# Patient Record
Sex: Male | Born: 1960 | Race: White | Hispanic: No | Marital: Married | Smoking: Never smoker
Health system: Southern US, Community
[De-identification: ages and names within clinical notes are randomized; demographics above are authoritative.]

## PROBLEM LIST (undated history)

## (undated) DIAGNOSIS — I10 Essential (primary) hypertension: Secondary | ICD-10-CM

---

## 2015-09-06 ENCOUNTER — Emergency Department (HOSPITAL_BASED_OUTPATIENT_CLINIC_OR_DEPARTMENT_OTHER): Payer: 59

## 2015-09-06 ENCOUNTER — Emergency Department (HOSPITAL_COMMUNITY): Payer: 59 | Admitting: Anesthesiology

## 2015-09-06 ENCOUNTER — Observation Stay (HOSPITAL_BASED_OUTPATIENT_CLINIC_OR_DEPARTMENT_OTHER)
Admission: EM | Admit: 2015-09-06 | Discharge: 2015-09-07 | Disposition: A | Discharge status: Home / Self Care | Payer: 59 | Attending: General Surgery | Admitting: General Surgery

## 2015-09-06 ENCOUNTER — Encounter (HOSPITAL_BASED_OUTPATIENT_CLINIC_OR_DEPARTMENT_OTHER): Payer: Self-pay | Admitting: Emergency Medicine

## 2015-09-06 ENCOUNTER — Encounter (HOSPITAL_COMMUNITY)
Admission: EM | Disposition: A | Discharge status: Home / Self Care | Payer: Self-pay | Source: Home / Self Care | Attending: Emergency Medicine

## 2015-09-06 DIAGNOSIS — K3580 Unspecified acute appendicitis: Secondary | ICD-10-CM

## 2015-09-06 DIAGNOSIS — I10 Essential (primary) hypertension: Secondary | ICD-10-CM | POA: Insufficient documentation

## 2015-09-06 DIAGNOSIS — K353 Acute appendicitis with localized peritonitis: Principal | ICD-10-CM | POA: Insufficient documentation

## 2015-09-06 DIAGNOSIS — K3533 Acute appendicitis with perforation and localized peritonitis, with abscess: Secondary | ICD-10-CM | POA: Diagnosis present

## 2015-09-06 DIAGNOSIS — R1031 Right lower quadrant pain: Secondary | ICD-10-CM | POA: Diagnosis present

## 2015-09-06 HISTORY — PX: LAPAROSCOPIC APPENDECTOMY: SHX408

## 2015-09-06 HISTORY — DX: Essential (primary) hypertension: I10

## 2015-09-06 LAB — COMPREHENSIVE METABOLIC PANEL
ALBUMIN: 3.6 g/dL (ref 3.5–5.0)
ALT: 22 U/L (ref 17–63)
AST: 22 U/L (ref 15–41)
Alkaline Phosphatase: 69 U/L (ref 38–126)
Anion gap: 10 (ref 5–15)
BUN: 19 mg/dL (ref 6–20)
CHLORIDE: 104 mmol/L (ref 101–111)
CO2: 21 mmol/L — AB (ref 22–32)
CREATININE: 1.41 mg/dL — AB (ref 0.61–1.24)
Calcium: 9.2 mg/dL (ref 8.9–10.3)
GFR calc Af Amer: >60 mL/min (ref >60)
GFR, EST NON AFRICAN AMERICAN: 55 mL/min — AB (ref >60)
GLUCOSE: 133 mg/dL — AB (ref 65–99)
POTASSIUM: 3.8 mmol/L (ref 3.5–5.1)
SODIUM: 135 mmol/L (ref 135–145)
Total Bilirubin: 1.9 mg/dL — ABNORMAL HIGH (ref 0.3–1.2)
Total Protein: 7.5 g/dL (ref 6.5–8.1)

## 2015-09-06 LAB — URINE MICROSCOPIC-ADD ON
Squamous Epithelial / LPF: NONE SEEN
WBC, UA: NONE SEEN WBC/hpf (ref 0–5)

## 2015-09-06 LAB — CBC
HEMATOCRIT: 43 % (ref 39.0–52.0)
Hemoglobin: 15.2 g/dL (ref 13.0–17.0)
MCH: 32.6 pg (ref 26.0–34.0)
MCHC: 35.3 g/dL (ref 30.0–36.0)
MCV: 92.3 fL (ref 78.0–100.0)
Platelets: 212 10*3/uL (ref 150–400)
RBC: 4.66 MIL/uL (ref 4.22–5.81)
RDW: 13 % (ref 11.5–15.5)
WBC: 15.9 10*3/uL — AB (ref 4.0–10.5)

## 2015-09-06 LAB — URINALYSIS, ROUTINE W REFLEX MICROSCOPIC
GLUCOSE, UA: NEGATIVE mg/dL
KETONES UR: 15 mg/dL — AB
LEUKOCYTES UA: NEGATIVE
Nitrite: NEGATIVE
PH: 6 (ref 5.0–8.0)
Protein, ur: 100 mg/dL — AB
Specific Gravity, Urine: 1.03 (ref 1.005–1.030)

## 2015-09-06 LAB — CREATININE, SERUM
Creatinine, Ser: 1.2 mg/dL (ref 0.61–1.24)
GFR calc non Af Amer: >60 mL/min (ref >60)

## 2015-09-06 LAB — LIPASE, BLOOD: LIPASE: 17 U/L (ref 11–51)

## 2015-09-06 SURGERY — APPENDECTOMY, LAPAROSCOPIC
Anesthesia: General | Site: Abdomen

## 2015-09-06 MED ORDER — IBUPROFEN 600 MG PO TABS: 600.0000 mg | ORAL_TABLET | Freq: Four times a day (QID) | ORAL | Status: DC | PRN

## 2015-09-06 MED ORDER — BUPIVACAINE-EPINEPHRINE (PF) 0.25% -1:200000 IJ SOLN
INTRAMUSCULAR | Status: AC
  Filled 2015-09-06: qty 30

## 2015-09-06 MED ORDER — ROCURONIUM BROMIDE 50 MG/5ML IV SOLN
INTRAVENOUS | Status: AC
  Filled 2015-09-06: qty 1

## 2015-09-06 MED ORDER — ONDANSETRON HCL 4 MG/2ML IJ SOLN
INTRAMUSCULAR | Status: AC
  Filled 2015-09-06: qty 2

## 2015-09-06 MED ORDER — HYDROMORPHONE HCL 1 MG/ML IJ SOLN
0.2500 mg | INTRAMUSCULAR | Status: DC | PRN
  Administered 2015-09-06: 0.5 mg via INTRAVENOUS

## 2015-09-06 MED ORDER — LIDOCAINE HCL (CARDIAC) 20 MG/ML IV SOLN
INTRAVENOUS | Status: AC
Start: 2015-09-06 — End: 2015-09-06
  Filled 2015-09-06: qty 5

## 2015-09-06 MED ORDER — SUCCINYLCHOLINE CHLORIDE 20 MG/ML IJ SOLN
INTRAMUSCULAR | Status: DC | PRN
  Administered 2015-09-06: 120 mg via INTRAVENOUS

## 2015-09-06 MED ORDER — BUPIVACAINE-EPINEPHRINE 0.25% -1:200000 IJ SOLN
INTRAMUSCULAR | Status: DC | PRN
  Administered 2015-09-06: 10 mL

## 2015-09-06 MED ORDER — LIDOCAINE HCL (CARDIAC) 20 MG/ML IV SOLN
INTRAVENOUS | Status: DC | PRN
  Administered 2015-09-06: 100 mg via INTRAVENOUS

## 2015-09-06 MED ORDER — OXYCODONE HCL 5 MG PO TABS: 5.0000 mg | ORAL_TABLET | ORAL | Status: DC | PRN

## 2015-09-06 MED ORDER — ONDANSETRON HCL 4 MG/2ML IJ SOLN
INTRAMUSCULAR | Status: DC | PRN
  Administered 2015-09-06: 4 mg via INTRAVENOUS

## 2015-09-06 MED ORDER — MIDAZOLAM HCL 5 MG/5ML IJ SOLN
INTRAMUSCULAR | Status: DC | PRN
  Administered 2015-09-06: 2 mg via INTRAVENOUS

## 2015-09-06 MED ORDER — SUGAMMADEX SODIUM 500 MG/5ML IV SOLN
INTRAVENOUS | Status: AC
  Filled 2015-09-06: qty 5

## 2015-09-06 MED ORDER — SUFENTANIL CITRATE 50 MCG/ML IV SOLN
INTRAVENOUS | Status: AC
  Filled 2015-09-06: qty 1

## 2015-09-06 MED ORDER — PHENYLEPHRINE 40 MCG/ML (10ML) SYRINGE FOR IV PUSH (FOR BLOOD PRESSURE SUPPORT)
PREFILLED_SYRINGE | INTRAVENOUS | Status: AC
  Filled 2015-09-06: qty 10

## 2015-09-06 MED ORDER — PHENYLEPHRINE HCL 10 MG/ML IJ SOLN
INTRAMUSCULAR | Status: DC | PRN
  Administered 2015-09-06 (×3): 80 ug via INTRAVENOUS

## 2015-09-06 MED ORDER — DIPHENHYDRAMINE HCL 25 MG PO CAPS: 25.0000 mg | ORAL_CAPSULE | Freq: Four times a day (QID) | ORAL | Status: DC | PRN

## 2015-09-06 MED ORDER — PROPOFOL 10 MG/ML IV BOLUS
INTRAVENOUS | Status: AC
  Filled 2015-09-06: qty 20

## 2015-09-06 MED ORDER — IOPAMIDOL (ISOVUE-300) INJECTION 61%
100.0000 mL | Freq: Once | INTRAVENOUS | Status: AC | PRN
  Administered 2015-09-06: 100 mL via INTRAVENOUS

## 2015-09-06 MED ORDER — DEXAMETHASONE SODIUM PHOSPHATE 4 MG/ML IJ SOLN
INTRAMUSCULAR | Status: DC | PRN
  Administered 2015-09-06: 4 mg via INTRAVENOUS

## 2015-09-06 MED ORDER — MORPHINE SULFATE (PF) 2 MG/ML IV SOLN: 2.0000 mg | INTRAVENOUS | Status: DC | PRN

## 2015-09-06 MED ORDER — LACTATED RINGERS IV SOLN
INTRAVENOUS | Status: DC | PRN
  Administered 2015-09-06 (×2): via INTRAVENOUS

## 2015-09-06 MED ORDER — SUCCINYLCHOLINE CHLORIDE 20 MG/ML IJ SOLN
INTRAMUSCULAR | Status: AC
  Filled 2015-09-06: qty 1

## 2015-09-06 MED ORDER — METHOCARBAMOL 500 MG PO TABS: 500.0000 mg | ORAL_TABLET | Freq: Four times a day (QID) | ORAL | Status: DC | PRN

## 2015-09-06 MED ORDER — SODIUM CHLORIDE 0.9 % IJ SOLN
INTRAMUSCULAR | Status: AC
  Filled 2015-09-06: qty 10

## 2015-09-06 MED ORDER — LACTATED RINGERS IV SOLN: INTRAVENOUS | Status: DC

## 2015-09-06 MED ORDER — DEXAMETHASONE SODIUM PHOSPHATE 4 MG/ML IJ SOLN
INTRAMUSCULAR | Status: AC
  Filled 2015-09-06: qty 1

## 2015-09-06 MED ORDER — PROPOFOL 10 MG/ML IV BOLUS
INTRAVENOUS | Status: DC | PRN
  Administered 2015-09-06: 150 mg via INTRAVENOUS

## 2015-09-06 MED ORDER — SUGAMMADEX SODIUM 200 MG/2ML IV SOLN
INTRAVENOUS | Status: DC | PRN
  Administered 2015-09-06: 500 mg via INTRAVENOUS

## 2015-09-06 MED ORDER — SODIUM CHLORIDE 0.9 % IV SOLN
INTRAVENOUS | Status: DC
  Administered 2015-09-06: 22:00:00 via INTRAVENOUS

## 2015-09-06 MED ORDER — ENOXAPARIN SODIUM 40 MG/0.4ML ~~LOC~~ SOLN
40.0000 mg | SUBCUTANEOUS | Status: DC
  Administered 2015-09-07: 40 mg via SUBCUTANEOUS
  Filled 2015-09-06: qty 0.4

## 2015-09-06 MED ORDER — ACETAMINOPHEN 500 MG PO TABS
1000.0000 mg | ORAL_TABLET | Freq: Four times a day (QID) | ORAL | Status: DC
  Administered 2015-09-07 (×2): 1000 mg via ORAL
  Filled 2015-09-06 (×2): qty 2

## 2015-09-06 MED ORDER — 0.9 % SODIUM CHLORIDE (POUR BTL) OPTIME
TOPICAL | Status: DC | PRN
  Administered 2015-09-06: 1000 mL

## 2015-09-06 MED ORDER — MIDAZOLAM HCL 2 MG/2ML IJ SOLN
INTRAMUSCULAR | Status: AC
  Filled 2015-09-06: qty 2

## 2015-09-06 MED ORDER — DEXTROSE 5 % IV SOLN
2.0000 g | INTRAVENOUS | Status: DC
  Filled 2015-09-06: qty 2

## 2015-09-06 MED ORDER — DEXTROSE 5 % IV SOLN
2.0000 g | Freq: Once | INTRAVENOUS | Status: AC
  Administered 2015-09-06: 2 g via INTRAVENOUS
  Filled 2015-09-06: qty 2

## 2015-09-06 MED ORDER — HYDROMORPHONE HCL 1 MG/ML IJ SOLN
INTRAMUSCULAR | Status: AC
  Filled 2015-09-06: qty 1

## 2015-09-06 MED ORDER — SUFENTANIL CITRATE 50 MCG/ML IV SOLN
INTRAVENOUS | Status: DC | PRN
  Administered 2015-09-06: 10 ug via INTRAVENOUS
  Administered 2015-09-06: 15 ug via INTRAVENOUS

## 2015-09-06 MED ORDER — MEPERIDINE HCL 25 MG/ML IJ SOLN: 6.2500 mg | INTRAMUSCULAR | Status: DC | PRN

## 2015-09-06 MED ORDER — ONDANSETRON 4 MG PO TBDP
4.0000 mg | ORAL_TABLET | Freq: Four times a day (QID) | ORAL | Status: DC | PRN
Start: 2015-09-06 — End: 2015-09-07

## 2015-09-06 MED ORDER — IBUPROFEN 800 MG PO TABS
800.0000 mg | ORAL_TABLET | Freq: Once | ORAL | Status: AC
  Administered 2015-09-06: 800 mg via ORAL
  Filled 2015-09-06: qty 1

## 2015-09-06 MED ORDER — METRONIDAZOLE IN NACL 5-0.79 MG/ML-% IV SOLN
500.0000 mg | Freq: Three times a day (TID) | INTRAVENOUS | Status: DC
  Administered 2015-09-07 (×2): 500 mg via INTRAVENOUS
  Filled 2015-09-06 (×4): qty 100

## 2015-09-06 MED ORDER — ONDANSETRON HCL 4 MG/2ML IJ SOLN: 4.0000 mg | Freq: Four times a day (QID) | INTRAMUSCULAR | Status: DC | PRN

## 2015-09-06 MED ORDER — ROCURONIUM BROMIDE 100 MG/10ML IV SOLN
INTRAVENOUS | Status: DC | PRN
Start: 2015-09-06 — End: 2015-09-06
  Administered 2015-09-06: 30 mg via INTRAVENOUS

## 2015-09-06 MED ORDER — DIPHENHYDRAMINE HCL 50 MG/ML IJ SOLN: 25.0000 mg | Freq: Four times a day (QID) | INTRAMUSCULAR | Status: DC | PRN

## 2015-09-06 SURGICAL SUPPLY — 40 items
APPLIER CLIP ROT 10 11.4 M/L (STAPLE)
BENZOIN TINCTURE PRP APPL 2/3 (GAUZE/BANDAGES/DRESSINGS) ×3 IMPLANT
BLADE SURG ROTATE 9660 (MISCELLANEOUS) IMPLANT
CANISTER SUCTION 2500CC (MISCELLANEOUS) ×3 IMPLANT
CHLORAPREP W/TINT 26ML (MISCELLANEOUS) ×3 IMPLANT
CLIP APPLIE ROT 10 11.4 M/L (STAPLE) IMPLANT
CLOSURE WOUND 1/2 X4 (GAUZE/BANDAGES/DRESSINGS) ×1
COVER SURGICAL LIGHT HANDLE (MISCELLANEOUS) ×3 IMPLANT
CUTTER FLEX LINEAR 45M (STAPLE) ×3 IMPLANT
DRSG TEGADERM 2-3/8X2-3/4 SM (GAUZE/BANDAGES/DRESSINGS) ×6 IMPLANT
DRSG TEGADERM 4X4.75 (GAUZE/BANDAGES/DRESSINGS) ×3 IMPLANT
ELECT REM PT RETURN 9FT ADLT (ELECTROSURGICAL) ×3
ELECTRODE REM PT RTRN 9FT ADLT (ELECTROSURGICAL) ×1 IMPLANT
ENDOLOOP SUT PDS II  0 18 (SUTURE)
ENDOLOOP SUT PDS II 0 18 (SUTURE) IMPLANT
FILTER SMOKE EVAC LAPAROSHD (FILTER) ×3 IMPLANT
GAUZE SPONGE 2X2 8PLY STRL LF (GAUZE/BANDAGES/DRESSINGS) ×1 IMPLANT
GLOVE BIO SURGEON STRL SZ7 (GLOVE) ×3 IMPLANT
GLOVE BIOGEL PI IND STRL 7.5 (GLOVE) ×1 IMPLANT
GLOVE BIOGEL PI INDICATOR 7.5 (GLOVE) ×2
GOWN STRL REUS W/ TWL LRG LVL3 (GOWN DISPOSABLE) ×3 IMPLANT
GOWN STRL REUS W/TWL LRG LVL3 (GOWN DISPOSABLE) ×6
KIT BASIN OR (CUSTOM PROCEDURE TRAY) ×3 IMPLANT
KIT ROOM TURNOVER OR (KITS) ×3 IMPLANT
NS IRRIG 1000ML POUR BTL (IV SOLUTION) ×3 IMPLANT
PAD ARMBOARD 7.5X6 YLW CONV (MISCELLANEOUS) ×6 IMPLANT
POUCH SPECIMEN RETRIEVAL 10MM (ENDOMECHANICALS) ×3 IMPLANT
RELOAD STAPLE TA45 3.5 REG BLU (ENDOMECHANICALS) ×3 IMPLANT
SCALPEL HARMONIC ACE (MISCELLANEOUS) ×3 IMPLANT
SET IRRIG TUBING LAPAROSCOPIC (IRRIGATION / IRRIGATOR) ×3 IMPLANT
SPECIMEN JAR SMALL (MISCELLANEOUS) ×3 IMPLANT
SPONGE GAUZE 2X2 STER 10/PKG (GAUZE/BANDAGES/DRESSINGS) ×2
STRIP CLOSURE SKIN 1/2X4 (GAUZE/BANDAGES/DRESSINGS) ×2 IMPLANT
SUT MNCRL AB 4-0 PS2 18 (SUTURE) ×3 IMPLANT
TOWEL OR 17X24 6PK STRL BLUE (TOWEL DISPOSABLE) ×3 IMPLANT
TOWEL OR 17X26 10 PK STRL BLUE (TOWEL DISPOSABLE) ×3 IMPLANT
TRAY LAPAROSCOPIC MC (CUSTOM PROCEDURE TRAY) ×3 IMPLANT
TROCAR XCEL BLADELESS 5X75MML (TROCAR) ×6 IMPLANT
TROCAR XCEL BLUNT TIP 100MML (ENDOMECHANICALS) ×3 IMPLANT
TUBING INSUFFLATION (TUBING) ×3 IMPLANT

## 2015-09-06 NOTE — Anesthesia Procedure Notes (Signed)
Procedure Name: Intubation Date/Time: 09/06/2015 8:13 PM Performed by: Melina SchoolsBANKS, Michaeline Eckersley J Pre-anesthesia Checklist: Patient identified, Emergency Drugs available, Suction available, Patient being monitored and Timeout performed Patient Re-evaluated:Patient Re-evaluated prior to inductionOxygen Delivery Method: Circle system utilized Preoxygenation: Pre-oxygenation with 100% oxygen Intubation Type: IV induction, Rapid sequence and Cricoid Pressure applied Ventilation: Mask ventilation without difficulty Laryngoscope Size: 3 Grade View: Grade II Tube type: Oral Tube size: 8.0 mm Number of attempts: 1 Airway Equipment and Method: Stylet Placement Confirmation: ETT inserted through vocal cords under direct vision,  positive ETCO2 and breath sounds checked- equal and bilateral Secured at: 23 cm Tube secured with: Tape Dental Injury: Teeth and Oropharynx as per pre-operative assessment

## 2015-09-06 NOTE — ED Notes (Signed)
Pt tolerated PO contrast well

## 2015-09-06 NOTE — ED Notes (Signed)
Dr. Corliss Skainssuei taking patient to OR bay in wheelchair at this time - informed consent obtained.

## 2015-09-06 NOTE — Op Note (Signed)
Appendectomy, Lap, Procedure Note  Indications: The patient presented with a 3-day history of right-sided abdominal pain. A CT scan revealed findings consistent with acute appendicitis.  Pre-operative Diagnosis: Acute appendicitis without mention of peritonitis  Post-operative Diagnosis: Acute appendicitis with peritoneal abscess  Surgeon: Wynona Luna.   Assistants: none  Anesthesia: General endotracheal anesthesia  ASA Class: 2E  Procedure Details  The patient was seen again in the Holding Room. The risks, benefits, complications, treatment options, and expected outcomes were discussed with the patient and/or family. The possibilities of reaction to medication, perforation of viscus, bleeding, recurrent infection, finding a normal appendix, the need for additional procedures, failure to diagnose a condition, and creating a complication requiring transfusion or operation were discussed. There was concurrence with the proposed plan and informed consent was obtained. The site of surgery was properly noted. The patient was taken to Operating Room, identified as Lillard Anes and the procedure verified as Appendectomy. A Time Out was held and the above information confirmed.  The patient was placed in the supine position and general anesthesia was induced.  The abdomen was prepped and draped in a sterile fashion. A one centimeter infraumbilical incision was made.  Dissection was carried down to the fascia bluntly.  The fascia was incised vertically.  We entered the peritoneal cavity bluntly.  A pursestring suture was passed around the incision with a 0 Vicryl.  The Hasson cannula was introduced into the abdomen and the tails of the suture were used to hold the Hasson in place.   The pneumoperitoneum was then established maintaining a maximum pressure of 15 mmHg.  Additional 5 mm cannulas then placed in the left lower quadrant of the abdomen and the right upper quadrant under direct visualization.  A careful evaluation of the entire abdomen was carried out. The patient was placed in Trendelenburg and left lateral decubitus position.  The scope was moved to the right upper quadrant port site. The cecum was identified.  The terminal ileum is densely adherent to the side of the cecum overlying the appendix.  We bluntly peeled this loop of ileum away and exposed a 2 cm appendiceal abscess.  The appendix was bluntly mobilized and showed some necrosis near its tip.  The proximal appendix is normal in size.The appendix was carefully dissected. The appendix was was skeletonized with the harmonic scalpel.   The appendix was divided at its base using an endo-GIA stapler. Minimal appendiceal stump was left in place. There was no evidence of bleeding, leakage, or complication after division of the appendix. Irrigation was also performed with 300 ml of saline and irrigate suctioned from the abdomen as well.  The umbilical port site was closed with the purse string suture. There was no residual palpable fascial defect.  The trocar site skin wounds were closed with 4-0 Monocryl.  Instrument, sponge, and needle counts were correct at the conclusion of the case.   Findings: The appendix was found to be inflamed. There were signs of necrosis.  There was perforation. There was abscess formation.  Estimated Blood Loss:  less than 50 mL         Drains: none         Specimens: appendix         Complications:  None; patient tolerated the procedure well.         Disposition: PACU - hemodynamically stable.         Condition: stable   Wilmon Arms. Corliss Skains, MD, [facility] Surgery  General/  Trauma Surgery  09/06/2015 9:07 PM

## 2015-09-06 NOTE — ED Provider Notes (Signed)
Patient is here with history of right-sided abdominal pain and fever.  Been going on for last 3 days ago worse today.  Some chills.  No nausea or vomiting.  Was turned over by Dr. Cherylynn RidgesGholston awaiting results of a CT scan.  CT showed acute appendicitis without rupture.  Patient is requesting to drive his own car [facility] to [facility].  The general surgeon at [facility] was contacted and will see the patient in the emergency room at [facility].  Patient was instructed to go directly to [facility] and not to stop and eat or drink anything prior to arriving at [facility].  He understands and will follow these instructions.  Nelva Nayobert Lilinoe Acklin, MD 09/06/15 [phone]

## 2015-09-06 NOTE — ED Notes (Signed)
MD at bedside. 

## 2015-09-06 NOTE — ED Notes (Signed)
Surgeon at bedside.  

## 2015-09-06 NOTE — H&P (Signed)
Johnny Perkins is an 55 y.o. male.   Chief Complaint: RLQ abdominal pain HPI:  This is a 55 year old male in town for [employer] with a past history of hypertension presents with right-sided abdominal pain. Has been having vague right-sided abdominal pain for the last 3 days, worse today. Has been having low-grade subjective fevers as well. No nausea, vomiting, or diarrhea. No constipation but he tried mag citrate with no change in his symptoms. Feels like he is urinating more but no dysuria or hematuria. No back pain.  Past Medical History  Diagnosis Date  . Hypertension     History reviewed. No pertinent past surgical history.  History reviewed. No pertinent family history. Social History:  reports that he has never smoked. He does not have any smokeless tobacco history on file. He reports that he does not drink alcohol. His drug history is not on file.  Allergies: No Known Allergies  Prior to Admission medications   Not on File     Results for orders placed or performed during the hospital encounter of 09/06/15 (from the past 48 hour(s))  Lipase, blood     Status: None   Collection Time: 09/06/15  2:29 PM  Result Value Ref Range   Lipase 17 11 - 51 U/L  Comprehensive metabolic panel     Status: Abnormal   Collection Time: 09/06/15  2:29 PM  Result Value Ref Range   Sodium 135 135 - 145 mmol/L   Potassium 3.8 3.5 - 5.1 mmol/L   Chloride 104 101 - 111 mmol/L   CO2 21 (L) 22 - 32 mmol/L   Glucose, Bld 133 (H) 65 - 99 mg/dL   BUN 19 6 - 20 mg/dL   Creatinine, Ser 1.41 (H) 0.61 - 1.24 mg/dL   Calcium 9.2 8.9 - 10.3 mg/dL   Total Protein 7.5 6.5 - 8.1 g/dL   Albumin 3.6 3.5 - 5.0 g/dL   AST 22 15 - 41 U/L   ALT 22 17 - 63 U/L   Alkaline Phosphatase 69 38 - 126 U/L   Total Bilirubin 1.9 (H) 0.3 - 1.2 mg/dL   GFR calc non Af Amer 55 (L) >60 mL/min   GFR calc Af Amer >60 >60 mL/min    Comment: (NOTE) The eGFR has been calculated using the CKD EPI equation. This calculation  has not been validated in all clinical situations. eGFR's persistently <60 mL/min signify possible Chronic Kidney Disease.    Anion gap 10 5 - 15  CBC     Status: Abnormal   Collection Time: 09/06/15  2:29 PM  Result Value Ref Range   WBC 15.9 (H) 4.0 - 10.5 K/uL   RBC 4.66 4.22 - 5.81 MIL/uL   Hemoglobin 15.2 13.0 - 17.0 g/dL   HCT 43.0 39.0 - 52.0 %   MCV 92.3 78.0 - 100.0 fL   MCH 32.6 26.0 - 34.0 pg   MCHC 35.3 30.0 - 36.0 g/dL   RDW 13.0 11.5 - 15.5 %   Platelets 212 150 - 400 K/uL  Urinalysis, Routine w reflex microscopic (not at [facility])     Status: Abnormal   Collection Time: 09/06/15  3:50 PM  Result Value Ref Range   Color, Urine AMBER (A) YELLOW    Comment: BIOCHEMICALS MAY BE AFFECTED BY COLOR   APPearance CLOUDY (A) CLEAR   Specific Gravity, Urine 1.030 1.005 - 1.030   pH 6.0 5.0 - 8.0   Glucose, UA NEGATIVE NEGATIVE mg/dL   Hgb urine dipstick  SMALL (A) NEGATIVE   Bilirubin Urine SMALL (A) NEGATIVE   Ketones, ur 15 (A) NEGATIVE mg/dL   Protein, ur 100 (A) NEGATIVE mg/dL   Nitrite NEGATIVE NEGATIVE   Leukocytes, UA NEGATIVE NEGATIVE  Urine microscopic-add on     Status: Abnormal   Collection Time: 09/06/15  3:50 PM  Result Value Ref Range   Squamous Epithelial / LPF NONE SEEN NONE SEEN   WBC, UA NONE SEEN 0 - 5 WBC/hpf   RBC / HPF 0-5 0 - 5 RBC/hpf   Bacteria, UA MANY (A) NONE SEEN   Casts HYALINE CASTS (A) NEGATIVE   Urine-Other MUCOUS PRESENT    Ct Abdomen Pelvis W Contrast  09/06/2015  CLINICAL DATA:  RIGHT lower quadrant pain for 3 days. Fever and chills EXAM: CT ABDOMEN AND PELVIS WITH CONTRAST TECHNIQUE: Multidetector CT imaging of the abdomen and pelvis was performed using the standard protocol following bolus administration of intravenous contrast. CONTRAST:  154m ISOVUE-300 IOPAMIDOL (ISOVUE-300) INJECTION 61% COMPARISON:  None. FINDINGS: Lower chest: Lung bases are clear. Hepatobiliary: No focal hepatic lesion. No biliary duct dilatation. Gallbladder is  normal. Common bile duct is normal. Pancreas: Pancreas is normal. No ductal dilatation. No pancreatic inflammation. Spleen: Normal spleen Adrenals/urinary tract: Adrenal glands and kidneys are normal. The ureters and bladder normal. Stomach/Bowel: Stomach, duodenum and small bowel are normal. There is inflammation in the RIGHT lower quadrant adjacent to the cecum. The appendix has an appendicolith in the mid body measuring 9 mm. Distal to the appendicolith, the appendiceal tip is distended to 15 mm (image 68, series 5). There is periappendiceal inflammation and mild fluid along the RIGHT pericolic gutter. No evidence of macro perforation or abscess. There is mild inflammation of the distal ileum adjacent to the inflamed appendix. The ascending, transverse, descending and sigmoid colon are normal. Vascular/Lymphatic: Abdominal aorta is normal caliber. There is no retroperitoneal or periportal lymphadenopathy. No pelvic lymphadenopathy. Reproductive: Prostate normal Other: No free fluid the pelvis Musculoskeletal: No aggressive osseous lesion. IMPRESSION: 1. Acute appendicitis with an appendicolith in the mid appendix. 2. Appendix is in a typical location medial to the cecum. Significant inflammation surrounding the appendix with no evidence of perforation. These results will be called to the ordering clinician or representative by the Radiologist Assistant, and communication documented in the PACS or zVision Dashboard. Electronically Signed   By: SSuzy BouchardM.D.   On: 09/06/2015 16:34    Review of Systems  Constitutional: Positive for fever. Negative for weight loss.  HENT: Negative for ear discharge, ear pain, hearing loss and tinnitus.   Eyes: Negative for blurred vision, double vision, photophobia and pain.  Respiratory: Negative for cough, sputum production and shortness of breath.   Cardiovascular: Negative for chest pain.  Gastrointestinal: Positive for abdominal pain. Negative for nausea and  vomiting.  Genitourinary: Positive for frequency. Negative for dysuria, urgency and flank pain.  Musculoskeletal: Negative for myalgias, back pain, joint pain, falls and neck pain.  Neurological: Negative for dizziness, tingling, sensory change, focal weakness, loss of consciousness and headaches.  Endo/Heme/Allergies: Does not bruise/bleed easily.  Psychiatric/Behavioral: Negative for depression, memory loss and substance abuse. The patient is not nervous/anxious.     Blood pressure 140/84, pulse 87, temperature 100.7 F (38.2 C), temperature source Oral, resp. rate 20, height 6' (1.829 m), weight 124.739 kg (275 lb), SpO2 95 %. Physical Exam  WDWN in NAD HEENT:  EOMI, sclera anicteric Neck:  No masses, no thyromegaly Lungs:  CTA bilaterally; normal respiratory effort CV:  Regular rate and rhythm; no murmurs Abd:  +bowel sounds, soft, mildly tender in RLQ; healed infraumbilical incision Ext:  Well-perfused; no edema Skin:  Warm, dry; no sign of jaundice  Assessment/Plan Acute appendicitis with no sign of perforation or abscess  To OR directly for laparoscopic appendectomy.  The surgical procedure has been discussed with the patient.  Potential risks, benefits, alternative treatments, and expected outcomes have been explained.  All of the patient's questions at this time have been answered.  The likelihood of reaching the patient's treatment goal is good.  The patient understand the proposed surgical procedure and wishes to proceed.   Maia Petties., MD 09/06/2015, 5:24 PM

## 2015-09-06 NOTE — ED Provider Notes (Signed)
CSN: [id number]     Arrival date & time 09/06/15  1333 History   First MD Initiated Contact with Patient 09/06/15 1412     Chief Complaint  Patient presents with  . Abdominal Pain     (Consider location/radiation/quality/duration/timing/severity/associated sxs/prior Treatment) HPI  55 year old male with a past history of hypertension presents with right-sided abdominal pain. Has been having vague right-sided abdominal pain for the last 3 days, worse today. Has been having low-grade subjective fevers as well. No nausea, vomiting, or diarrhea. No constipation but he tried mag citrate with no change in his symptoms. Feels like he is urinating more but no dysuria or hematuria. No back pain.  Past Medical History  Diagnosis Date  . Hypertension    History reviewed. No pertinent past surgical history. History reviewed. No pertinent family history. Social History  Substance Use Topics  . Smoking status: Never Smoker   . Smokeless tobacco: None  . Alcohol Use: No    Review of Systems  Constitutional: Positive for fever.  Gastrointestinal: Positive for abdominal pain. Negative for nausea, vomiting, diarrhea and constipation.  Genitourinary: Positive for frequency. Negative for dysuria.  Musculoskeletal: Negative for back pain.  All other systems reviewed and are negative.     Allergies  Review of patient's allergies indicates no known allergies.  Home Medications   Prior to Admission medications   Not on File   BP 117/79 mmHg  Pulse 106  Temp(Src) 100.7 F (38.2 C) (Oral)  Resp 22  Ht 6' (1.829 m)  Wt 275 lb (124.739 kg)  BMI 37.29 kg/m2  SpO2 100% Physical Exam  Constitutional: He is oriented to person, place, and time. He appears well-developed and well-nourished.  HENT:  Head: Normocephalic and atraumatic.  Right Ear: External ear normal.  Left Ear: External ear normal.  Nose: Nose normal.  Eyes: Right eye exhibits no discharge. Left eye exhibits no discharge.   Neck: Neck supple.  Cardiovascular: Normal rate, regular rhythm, normal heart sounds and intact distal pulses.   Pulmonary/Chest: Effort normal and breath sounds normal.  Abdominal: Soft. There is tenderness (Most prominent at [place]. Also pain in right mid-abdomen).  Musculoskeletal: He exhibits no edema.  Neurological: He is alert and oriented to person, place, and time.  Skin: Skin is warm and dry. He is not diaphoretic.  Nursing note and vitals reviewed.   ED Course  Procedures (including critical care time) Labs Review Labs Reviewed  COMPREHENSIVE METABOLIC PANEL - Abnormal; Notable for the following:    CO2 21 (*)    Glucose, Bld 133 (*)    Creatinine, Ser 1.41 (*)    Total Bilirubin 1.9 (*)    GFR calc non Af Amer 55 (*)    All other components within normal limits  CBC - Abnormal; Notable for the following:    WBC 15.9 (*)    All other components within normal limits  LIPASE, BLOOD  URINALYSIS, ROUTINE W REFLEX MICROSCOPIC (NOT AT [facility])    Imaging Review No results found. I have personally reviewed and evaluated these images and lab results as part of my medical decision-making.   EKG Interpretation None      MDM   Final diagnoses:  None    I am most concerned about appendicitis given presentation. No peritonitis on exam and patient declines pain meds. CT currently pending, care transferred to Dr. Radford PaxBeaton with CT and urine pending.    Pricilla LovelessScott Majour Frei, MD 09/06/15 [phone]

## 2015-09-06 NOTE — ED Notes (Signed)
Phone Hand Off report given to Chad-RN, Consulting civil engineercharge RN at [facility] ED

## 2015-09-06 NOTE — ED Notes (Signed)
Pt visiting from [place], in town for furniture market, thur awoke with fever generalized malaise, symptoms resolved, today develop rlq intermittent abdominal pain with intermittent chills, sweats

## 2015-09-06 NOTE — Anesthesia Postprocedure Evaluation (Signed)
Anesthesia Post Note  Patient: Johnny Perkins  Procedure(s) Performed: Procedure(s) (LRB): APPENDECTOMY LAPAROSCOPIC (N/A)  Patient location during evaluation: PACU Anesthesia Type: General Level of consciousness: awake and alert Pain management: pain level controlled Vital Signs Assessment: post-procedure vital signs reviewed and stable Respiratory status: spontaneous breathing, nonlabored ventilation, respiratory function stable and patient connected to nasal cannula oxygen Cardiovascular status: blood pressure returned to baseline and stable Postop Assessment: no signs of nausea or vomiting Anesthetic complications: no    Last Vitals:  Filed Vitals:   09/06/15 2136 09/06/15 2149  BP: 134/73 123/73  Pulse: 77 78  Temp: 37.3 C 37 C  Resp: 20 18    Last Pain:  Filed Vitals:   09/06/15 2151  PainSc: 3                  Shelton SilvasKevin D Andy Moye

## 2015-09-06 NOTE — ED Provider Notes (Signed)
MSE was initiated and I personally evaluated the patient and placed orders (if any) at  6:33 PM on September 06, 2015.  BP 140/84 mmHg  Pulse 87  Temp(Src) 100.7 F (38.2 C) (Oral)  Resp 20  Ht 6' (1.829 m)  Wt 124.739 kg  BMI 37.29 kg/m2  SpO2 95%   Johnny Perkins is a 55 y.o. male who is from out of [place] the furniture Johnny Beaversmarke was [facility] and dx with appendicitis. He is here for evaluation by the general surgeon. Patient in NAD and denies pain at this time.   Abdomen soft, tender with palpation RLQ.   The patient appears stable so that the remainder of the MSE may be completed by another provider.  [id number]Hope [place], [place] 09/06/15 Carlis [id number]  Loren Raceravid Yelverton, MD 09/06/15 2350

## 2015-09-06 NOTE — ED Notes (Signed)
Pt reminded to remain NPO Last fluids per pt: 1130hrs Last solids per pt: 1230hrs, very light lunch

## 2015-09-06 NOTE — Anesthesia Preprocedure Evaluation (Signed)
Anesthesia Evaluation  Patient identified by MRN, date of birth, ID band Patient awake    Reviewed: Allergy & Precautions, NPO status , Patient's Chart, lab work & pertinent test results  Airway Mallampati: III  TM Distance: >3 FB Neck ROM: Full    Dental  (+) Teeth Intact   Pulmonary neg pulmonary ROS,    breath sounds clear to auscultation       Cardiovascular hypertension, Pt. on medications  Rhythm:Regular Rate:Normal     Neuro/Psych negative neurological ROS  negative psych ROS   GI/Hepatic negative GI ROS, Neg liver ROS,   Endo/Other  negative endocrine ROS  Renal/GU negative Renal ROS  negative genitourinary   Musculoskeletal negative musculoskeletal ROS (+)   Abdominal   Peds negative pediatric ROS (+)  Hematology   Anesthesia Other Findings   Reproductive/Obstetrics negative OB ROS                             Anesthesia Physical Anesthesia Plan  ASA: II  Anesthesia Plan: General   Post-op Pain Management:    Induction: Intravenous  Airway Management Planned: Oral ETT  Additional Equipment:   Intra-op Plan:   Post-operative Plan: Extubation in OR  Informed Consent: I have reviewed the patients History and Physical, chart, labs and discussed the procedure including the risks, benefits and alternatives for the proposed anesthesia with the patient or authorized representative who has indicated his/her understanding and acceptance.   Dental advisory given  Plan Discussed with: CRNA  Anesthesia Plan Comments:         Anesthesia Quick Evaluation

## 2015-09-06 NOTE — Transfer of Care (Signed)
Immediate Anesthesia Transfer of Care Note  Patient: Johnny AnesJohn Perkins  Procedure(s) Performed: Procedure(s): APPENDECTOMY LAPAROSCOPIC (N/A)  Patient Location: PACU  Anesthesia Type:General  Level of Consciousness: alert , oriented, sedated, patient cooperative and responds to stimulation  Airway & Oxygen Therapy: Patient Spontanous Breathing and Patient connected to nasal cannula oxygen  Post-op Assessment: Report given to RN, Post -op Vital signs reviewed and stable and Patient moving all extremities X 4  Post vital signs: Reviewed and stable  Last Vitals:  Filed Vitals:   09/06/15 1702 09/06/15 2115  BP: 140/84 135/67  Pulse: 87 81  Temp:  36.8 C  Resp: 20 19    Complications: No apparent anesthesia complications

## 2015-09-06 NOTE — ED Notes (Signed)
States only surgery has been a hernia repair

## 2015-09-06 NOTE — ED Notes (Signed)
States been having rt lower quad pain for the past several days, states has used a Mag Citrate drink, with good results, been having some fevers, feeling warm. Denies N/V or D, appetite okay, states has had an increase in urination.

## 2015-09-07 LAB — CBC
HCT: 40.5 % (ref 39.0–52.0)
HEMATOCRIT: 40.1 % (ref 39.0–52.0)
HEMOGLOBIN: 13.5 g/dL (ref 13.0–17.0)
Hemoglobin: 13.6 g/dL (ref 13.0–17.0)
MCH: 31.7 pg (ref 26.0–34.0)
MCH: 31.5 pg (ref 26.0–34.0)
MCHC: 33.6 g/dL (ref 30.0–36.0)
MCHC: 33.7 g/dL (ref 30.0–36.0)
MCV: 94.4 fL (ref 78.0–100.0)
MCV: 93.5 fL (ref 78.0–100.0)
PLATELETS: 196 10*3/uL (ref 150–400)
Platelets: 185 K/uL (ref 150–400)
RBC: 4.29 MIL/uL (ref 4.22–5.81)
RBC: 4.29 MIL/uL (ref 4.22–5.81)
RDW: 13.1 % (ref 11.5–15.5)
RDW: 13.3 % (ref 11.5–15.5)
WBC: 13.6 K/uL — ABNORMAL HIGH (ref 4.0–10.5)
WBC: 13.6 10*3/uL — AB (ref 4.0–10.5)

## 2015-09-07 LAB — BASIC METABOLIC PANEL
Anion gap: 10 (ref 5–15)
BUN: 9 mg/dL (ref 6–20)
CHLORIDE: 105 mmol/L (ref 101–111)
CO2: 21 mmol/L — AB (ref 22–32)
Calcium: 8.3 mg/dL — ABNORMAL LOW (ref 8.9–10.3)
Creatinine, Ser: 0.99 mg/dL (ref 0.61–1.24)
GFR calc non Af Amer: >60 mL/min (ref >60)
Glucose, Bld: 146 mg/dL — ABNORMAL HIGH (ref 65–99)
POTASSIUM: 4.5 mmol/L (ref 3.5–5.1)
SODIUM: 136 mmol/L (ref 135–145)

## 2015-09-07 MED ORDER — OXYCODONE HCL 5 MG PO TABS: 5.0000 mg | ORAL_TABLET | Freq: Four times a day (QID) | ORAL | Status: AC | PRN

## 2015-09-07 MED ORDER — IBUPROFEN 600 MG PO TABS: 600.0000 mg | ORAL_TABLET | Freq: Four times a day (QID) | ORAL | Status: AC | PRN

## 2015-09-07 MED ORDER — ACETAMINOPHEN 500 MG PO TABS: 1000.0000 mg | ORAL_TABLET | Freq: Four times a day (QID) | ORAL | Status: AC

## 2015-09-07 MED ORDER — AMOXICILLIN-POT CLAVULANATE 875-125 MG PO TABS: 1.0000 | ORAL_TABLET | Freq: Two times a day (BID) | ORAL | Status: AC

## 2015-09-07 NOTE — Progress Notes (Signed)
IV removed. AVS given to patient. Understanding verbalized. [place] here [place]. Will stay in hotel till Wed, rest, then drive himself home with intermittent breaks. Full understanding demonstrated that he cannot drive if taking pain meds.

## 2015-09-07 NOTE — Discharge Summary (Signed)
[facility] Discharge Summary   Patient ID: Johnny Perkins MRN: [id number] DOB/AGE: [birth year] 56 y.o.  Admit date: 09/06/2015 Discharge date: 09/07/2015  Admitting Diagnosis: Acute appendicitis with appendiceal abscess  Discharge Diagnosis Patient Active Problem List   Diagnosis Date Noted  . Acute appendicitis with appendiceal abscess 09/06/2015    Consultants None  Imaging: Ct Abdomen Pelvis W Contrast  09/06/2015  CLINICAL DATA:  RIGHT lower quadrant pain for 3 days. Fever and chills EXAM: CT ABDOMEN AND PELVIS WITH CONTRAST TECHNIQUE: Multidetector CT imaging of the abdomen and pelvis was performed using the standard protocol following bolus administration of intravenous contrast. CONTRAST:  100mL ISOVUE-300 IOPAMIDOL (ISOVUE-300) INJECTION 61% COMPARISON:  None. FINDINGS: Lower chest: Lung bases are clear. Hepatobiliary: No focal hepatic lesion. No biliary duct dilatation. Gallbladder is normal. Common bile duct is normal. Pancreas: Pancreas is normal. No ductal dilatation. No pancreatic inflammation. Spleen: Normal spleen Adrenals/urinary tract: Adrenal glands and kidneys are normal. The ureters and bladder normal. Stomach/Bowel: Stomach, duodenum and small bowel are normal. There is inflammation in the RIGHT lower quadrant adjacent to the cecum. The appendix has an appendicolith in the mid body measuring 9 mm. Distal to the appendicolith, the appendiceal tip is distended to 15 mm (image 68, series 5). There is periappendiceal inflammation and mild fluid along the RIGHT pericolic gutter. No evidence of macro perforation or abscess. There is mild inflammation of the distal ileum adjacent to the inflamed appendix. The ascending, transverse, descending and sigmoid colon are normal. Vascular/Lymphatic: Abdominal aorta is normal caliber. There is no retroperitoneal or periportal lymphadenopathy. No pelvic lymphadenopathy. Reproductive: Prostate normal Other: No free fluid the  pelvis Musculoskeletal: No aggressive osseous lesion. IMPRESSION: 1. Acute appendicitis with an appendicolith in the mid appendix. 2. Appendix is in a typical location medial to the cecum. Significant inflammation surrounding the appendix with no evidence of perforation. These results will be called to the ordering clinician or representative by the Radiologist Assistant, and communication documented in the PACS or zVision Dashboard. Electronically Signed   By: Genevive BiStewart  Edmunds M.D.   On: 09/06/2015 16:34    Procedures Dr. Corliss Skainssuei (09/06/15) - Laparoscopic Appendectomy  Hospital Course:  55 year old [place] [employer] with a past history of hypertension presents to [facility] with right-sided abdominal pain. Has been having vague right-sided abdominal pain for the last 3 days, worse today. Has been having low-grade subjective fevers as well. No nausea, vomiting, or diarrhea. No constipation but he tried mag citrate with no change in his symptoms. Feels like he is urinating more but no dysuria or hematuria. No back pain.  [facility] and he drove himself here.  Workup showed acute appendicitis.  Patient was admitted and underwent procedure listed above.  He was found to have perforated appendicitis with abscess.  Tolerated procedure well and was transferred to the floor.  Diet was advanced as tolerated.  On POD #1, the patient was voiding well, tolerating diet, ambulating well, pain well controlled, vital signs stable, incisions c/d/i and felt stable for discharge home.  Patient will follow up in our office in 2 weeks and knows to call with questions or concerns.  He will call to arrange a follow up appointment with his primary care provider within 2-3 weeks [place]  He will be given 7 days of Augmentin to help resolve the infection.  We discussed thoroughly warning signs of intra-abdominal infection.  We discussed him driving  home to  [place] and the need to get out and walk around every hour to prevent blood clots in the calf.    Physical Exam: General:  Alert, NAD, pleasant, comfortable Abd:  Soft, ND, mild tenderness, incisions C/D/I    Medication List    TAKE these medications        acetaminophen 500 MG tablet  Commonly known as:  TYLENOL  Take 2 tablets (1,000 mg total) by mouth every 6 (six) hours.     amoxicillin-clavulanate 875-125 MG tablet  Commonly known as:  AUGMENTIN  Take 1 tablet by mouth 2 (two) times daily.     ibuprofen 600 MG tablet  Commonly known as:  ADVIL,MOTRIN  Take 1 tablet (600 mg total) by mouth every 6 (six) hours as needed for mild pain.     oxyCODONE 5 MG immediate release tablet  Commonly known as:  Oxy IR/ROXICODONE  Take 1-2 tablets (5-10 mg total) by mouth every 6 (six) hours as needed for moderate pain.         Follow-up Information    Schedule an appointment as soon as possible for a visit with [facility].   Why:  For post-operation check in 2-3 weeks with your Primary care provider to recheck you after your surgery      Signed: Nonie Hoyer, [facility] [phone]  09/07/2015, 8:57 AM

## 2015-09-07 NOTE — Discharge Instructions (Signed)
Your appointment is at 9:15am, please arrive at least 30min before your appointment to complete your check in paperwork.  If you are unable to arrive 30min prior to your appointment time we may have to cancel or reschedule you. ° °LAPAROSCOPIC SURGERY: POST OP INSTRUCTIONS  °1. DIET: Follow a light bland diet the first 24 hours after arrival home, such as soup, liquids, crackers, etc. Be sure to include lots of fluids daily. Avoid fast food or heavy meals as your are more likely to get nauseated. Eat a low fat the next few days after surgery.  °2. Take your usually prescribed home medications unless otherwise directed. °3. PAIN CONTROL:  °1. Pain is best controlled by a usual combination of three different methods TOGETHER:  °1. Ice/Heat °2. Over the counter pain medication °3. Prescription pain medication °2. Most patients will experience some swelling and bruising around the incisions. Ice packs or heating pads (30-60 minutes up to 6 times a day) will help. Use ice for the first few days to help decrease swelling and bruising, then switch to heat to help relax tight/sore spots and speed recovery. Some people prefer to use ice alone, heat alone, alternating between ice & heat. Experiment to what works for you. Swelling and bruising can take several weeks to resolve.  °3. It is helpful to take an over-the-counter pain medication regularly for the first few weeks. Choose one of the following that works best for you:  °1. Naproxen (Aleve, etc) Two 220mg tabs twice a day °2. Ibuprofen (Advil, etc) Three 200mg tabs four times a day (every meal & bedtime) °3. Acetaminophen (Tylenol, etc) 500-650mg four times a day (every meal & bedtime) °4. A prescription for pain medication (such as oxycodone, hydrocodone, etc) should be given to you upon discharge. Take your pain medication as prescribed.  °1. If you are having problems/concerns with the prescription medicine (does not control pain, nausea, vomiting, rash, itching,  etc), please call us [phone] to see if we need to switch you to a different pain medicine that will work better for you and/or control your side effect better. °2. If you need a refill on your pain medication, please contact your pharmacy. They will contact our office to request authorization. Prescriptions will not be filled after 5 pm or on week-ends. °4. Avoid getting constipated. Between the surgery and the pain medications, it is common to experience some constipation. Increasing fluid intake and taking a fiber supplement (such as Metamucil, Citrucel, FiberCon, MiraLax, etc) 1-2 times a day regularly will usually help prevent this problem from occurring. A mild laxative (prune juice, Milk of Magnesia, MiraLax, etc) should be taken according to package directions if there are no bowel movements after 48 hours.  °5. Watch out for diarrhea. If you have many loose bowel movements, simplify your diet to bland foods & liquids for a few days. Stop any stool softeners and decrease your fiber supplement. Switching to mild anti-diarrheal medications (Kayopectate, Pepto Bismol) can help. If this worsens or does not improve, please call us. °6. Wash / shower every day. You may shower over the dressings as they are waterproof. Continue to shower over incision(s) after the dressing is off. °7. Remove your waterproof bandages 5 days after surgery. You may leave the incision open to air. You may replace a dressing/Band-Aid to cover the incision for comfort if you wish.  °8. ACTIVITIES as tolerated:  °1. You may resume regular (light) daily activities beginning the next day--such as daily self-care,   walking, climbing stairs--gradually increasing activities as tolerated. If you can walk 30 minutes without difficulty, it is safe to try more intense activity such as jogging, treadmill, bicycling, low-impact aerobics, swimming, etc. °2. Save the most intensive and strenuous activity for last such as sit-ups, heavy lifting,  contact sports, etc Refrain from any heavy lifting or straining until you are off narcotics for pain control.  °3. DO NOT PUSH THROUGH PAIN. Let pain be your guide: If it hurts to do something, don't do it. Pain is your body warning you to avoid that activity for another week until the pain goes down. °4. You may drive when you are no longer taking prescription pain medication, you can comfortably wear a seatbelt, and you can safely maneuver your car and apply brakes. °5. You may have sexual intercourse when it is comfortable.  °9. FOLLOW UP in our office  °1. Please call [facility] at [phone] to set up an appointment to see your surgeon in the office for a follow-up appointment approximately 2-3 weeks after your surgery. °2. Make sure that you call for this appointment the day you arrive home to insure a convenient appointment time. °     10. IF YOU HAVE DISABILITY OR FAMILY LEAVE FORMS, BRING THEM TO THE               OFFICE FOR PROCESSING.  ° °WHEN TO CALL US [phone]:  °1. Poor pain control °2. Reactions / problems with new medications (rash/itching, nausea, etc)  °3. Fever over 101.5 F (38.5 C) °4. Inability to urinate °5. Nausea and/or vomiting °6. Worsening swelling or bruising °7. Continued bleeding from incision. °8. Increased pain, redness, or drainage from the incision ° °The clinic staff is available to answer your questions during regular business hours (8:30am-5pm). Please don’t hesitate to call and ask to speak to one of our nurses for clinical concerns.  °If you have a medical emergency, go to the nearest emergency room or call 911.  °A surgeon from [facility] is always on call at the hospitals  ° °[facility]  °[address], [address], [place], [place] [address] ?  °MAIN: [phone] ? TOLL FREE: [phone] ?  °FAX [phone]  °[email or web address] ° °

## 2015-09-08 ENCOUNTER — Encounter (HOSPITAL_COMMUNITY): Payer: Self-pay | Admitting: Surgery

## 2016-11-22 IMAGING — CT CT ABD-PELV W/ CM
2 of 5 series · 16 of 46 positions shown, 18 images · IV contrast (APPLIED)
Comparison: None.

CLINICAL DATA: RIGHT lower quadrant pain for 3 days. Fever and
chills

EXAM:
CT ABDOMEN AND PELVIS WITH CONTRAST
TECHNIQUE: Multidetector CT imaging of the abdomen and pelvis was performed
using the standard protocol following bolus administration of
intravenous contrast.
CONTRAST:  100mL ETQZJH-RMM IOPAMIDOL (ETQZJH-RMM) INJECTION 61%

[Series 4: coronal st · coronal · 0.87mm/px · 3 of 109 slices shown]
[im 37/109  soft-tissue]
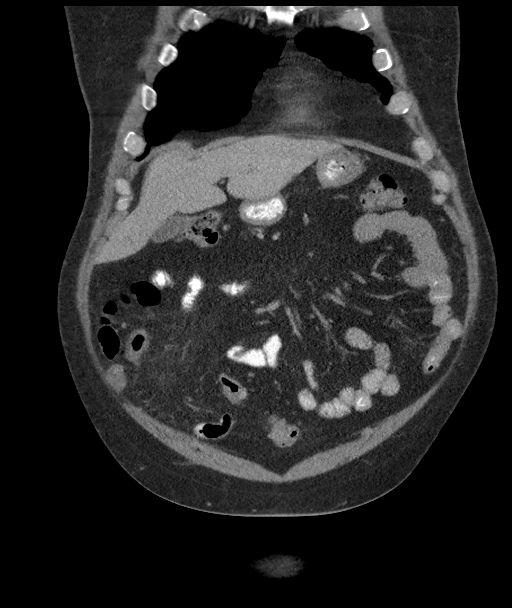
[im 49/109  soft-tissue]
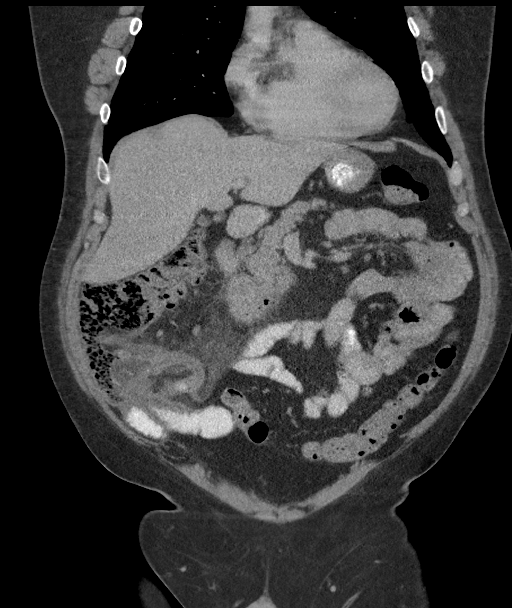
[im 61/109  soft-tissue]
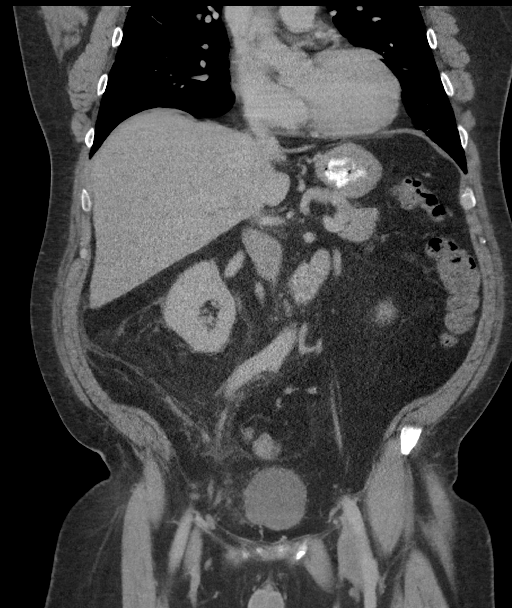

[Series 5: axial st · axial · 0.98mm/px · z∈[-506,-36]mm · 13 of 106 slices shown, 15 images]
[im 6/106  soft-tissue]
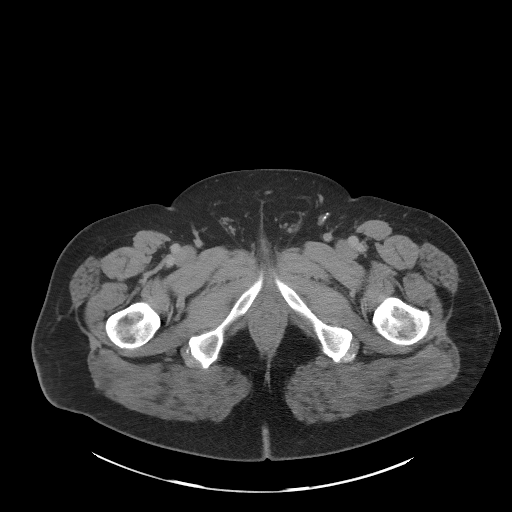
[im 6/106  bone]
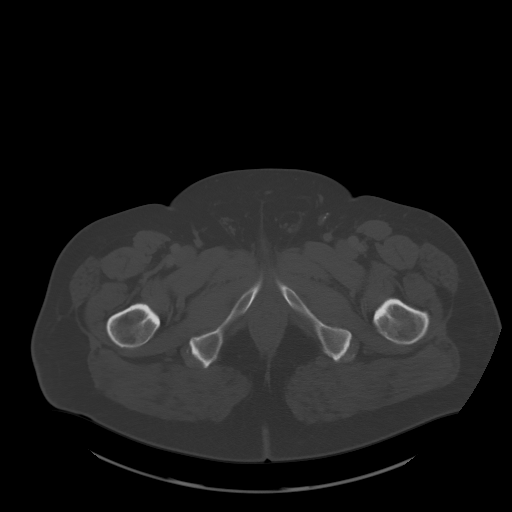
[im 12/106  soft-tissue]
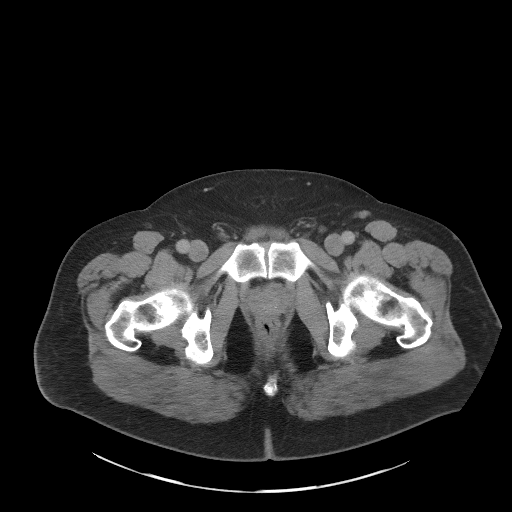
[im 24/106  soft-tissue]
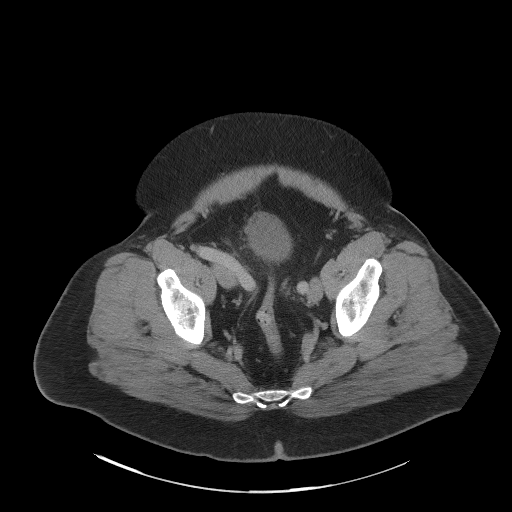
[im 30/106  soft-tissue]
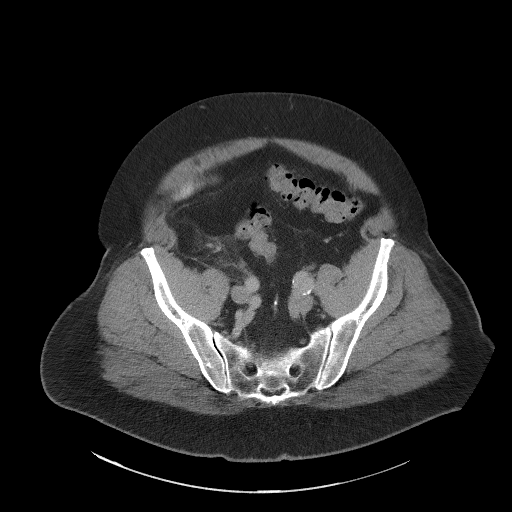
[im 36/106  soft-tissue]
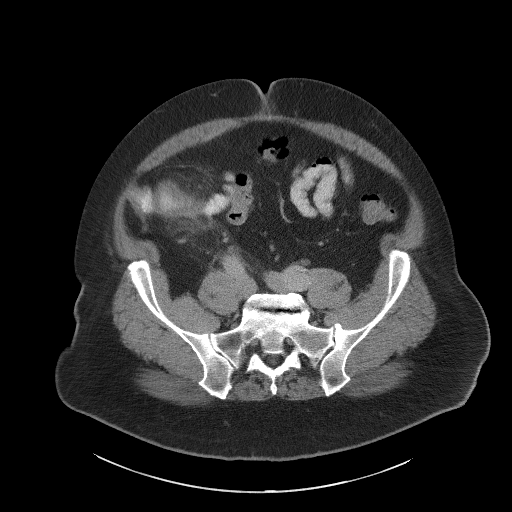
[im 47/106  soft-tissue]
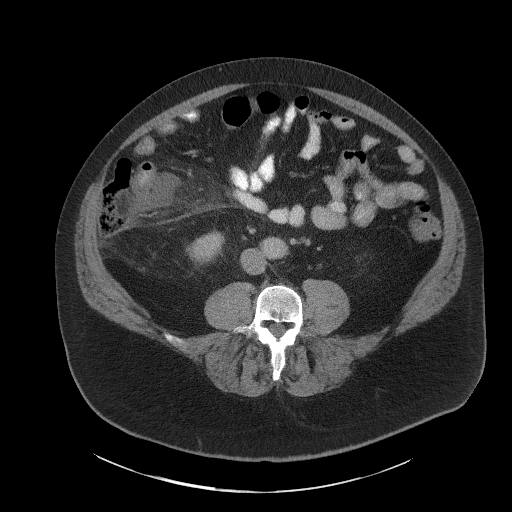
[im 53/106  soft-tissue]
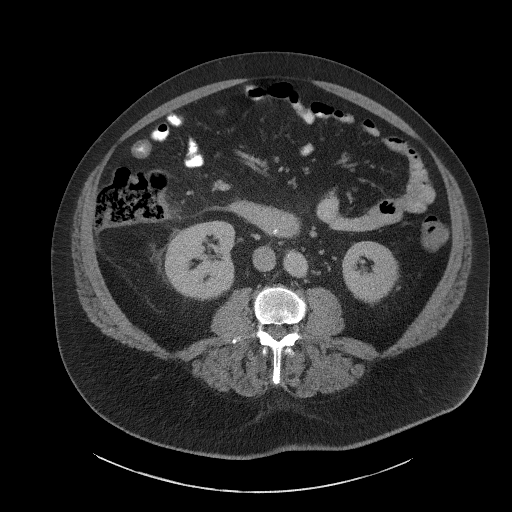
[im 59/106  soft-tissue]
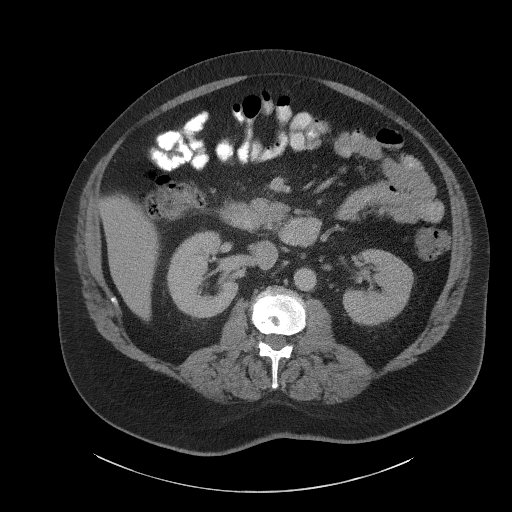
[im 71/106  soft-tissue]
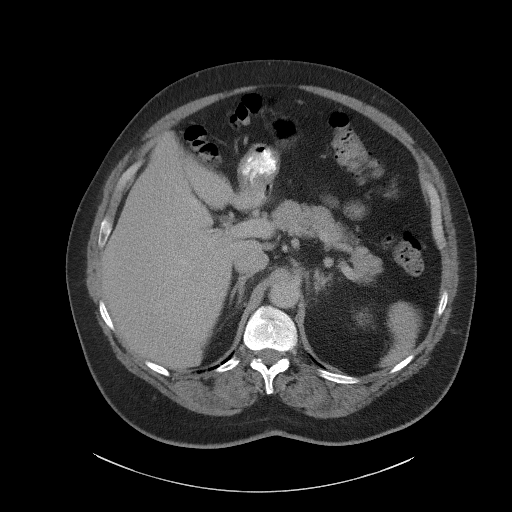
[im 71/106  bone]
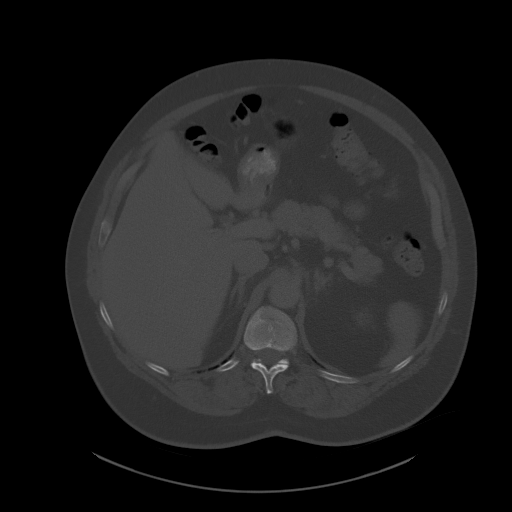
[im 76/106  soft-tissue]
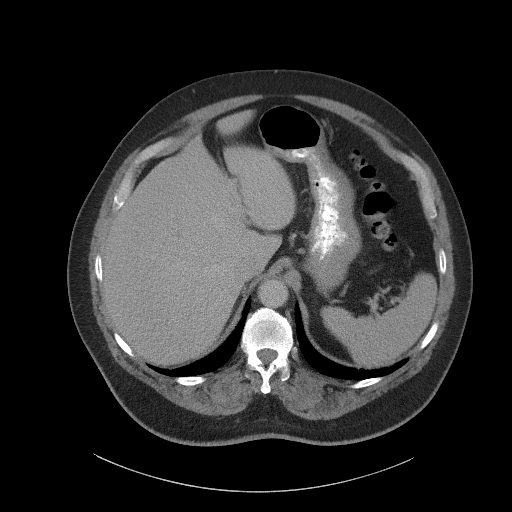
[im 82/106  soft-tissue]
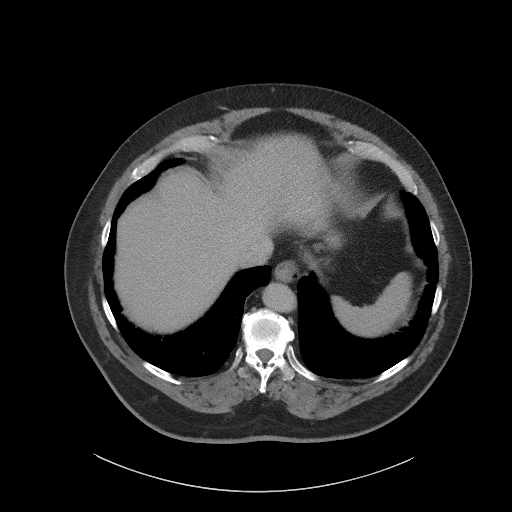
[im 94/106  soft-tissue]
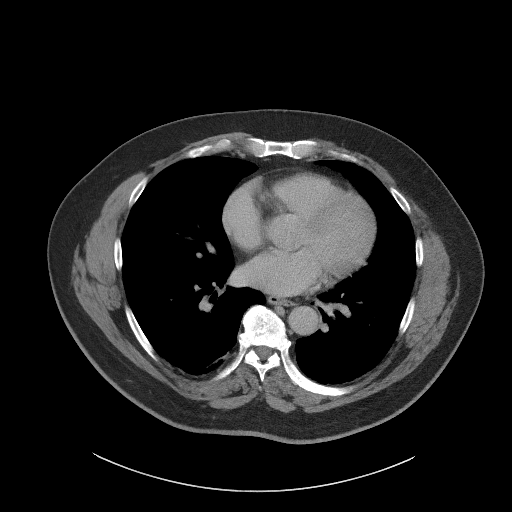
[im 100/106  soft-tissue]
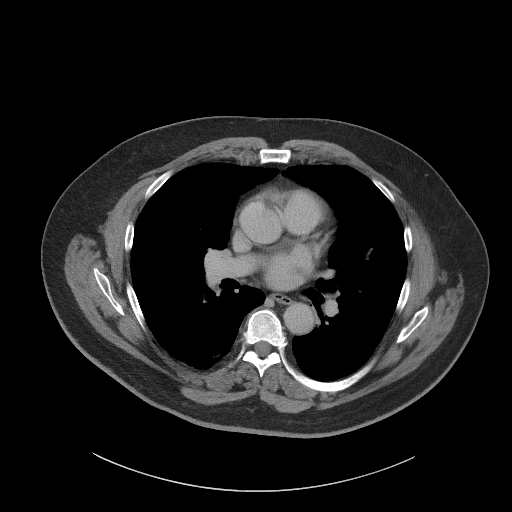

[16 of 46 positions shown; findings below may reference images not displayed]

FINDINGS: Lower chest: Lung bases are clear.

Hepatobiliary: No focal hepatic lesion. No biliary duct dilatation.
Gallbladder is normal. Common bile duct is normal.

Pancreas: Pancreas is normal. No ductal dilatation. No pancreatic
inflammation.

Spleen: Normal spleen

Adrenals/urinary tract: Adrenal glands and kidneys are normal. The
ureters and bladder normal.

Stomach/Bowel: Stomach, duodenum and small bowel are normal. There
is inflammation in the RIGHT lower quadrant adjacent to the cecum.
The appendix has an appendicolith in the mid body measuring 9 mm.
Distal to the appendicolith, the appendiceal tip is distended to 15
mm (image 68, series 5). There is periappendiceal inflammation and
mild fluid along the RIGHT pericolic gutter. No evidence of macro
perforation or abscess.

There is mild inflammation of the distal ileum adjacent to the
inflamed appendix.

The ascending, transverse, descending and sigmoid colon are normal.

Vascular/Lymphatic: Abdominal aorta is normal caliber. There is no
retroperitoneal or periportal lymphadenopathy. No pelvic
lymphadenopathy.

Reproductive: Prostate normal

Other: No free fluid the pelvis

Musculoskeletal: No aggressive osseous lesion.
IMPRESSION: 1. Acute appendicitis with an appendicolith in the mid appendix.
2. Appendix is in a typical location medial to the cecum.
Significant inflammation surrounding the appendix with no evidence
of perforation.
These results will be called to the ordering clinician or
representative by the Radiologist Assistant, and communication
documented in the PACS or zVision Dashboard.

## 2017-07-15 DEATH — deceased
